# Patient Record
Sex: Male | Born: 1965 | Race: White | Hispanic: No | Marital: Married | Smoking: Never smoker
Health system: Southern US, Community
[De-identification: ages and names within clinical notes are randomized; demographics above are authoritative.]

## PROBLEM LIST (undated history)

## (undated) DIAGNOSIS — K219 Gastro-esophageal reflux disease without esophagitis: Secondary | ICD-10-CM

## (undated) DIAGNOSIS — K900 Celiac disease: Secondary | ICD-10-CM

---

## 2010-01-14 ENCOUNTER — Emergency Department (HOSPITAL_COMMUNITY): Admission: EM | Admit: 2010-01-14 | Discharge: 2010-01-14 | Payer: Self-pay | Admitting: Emergency Medicine

## 2013-07-11 ENCOUNTER — Emergency Department (HOSPITAL_COMMUNITY)
Admission: EM | Admit: 2013-07-11 | Discharge: 2013-07-11 | Disposition: A | Discharge status: Home / Self Care | Payer: Worker's Compensation | Attending: Emergency Medicine | Admitting: Emergency Medicine

## 2013-07-11 ENCOUNTER — Encounter (HOSPITAL_COMMUNITY): Payer: Self-pay | Admitting: Emergency Medicine

## 2013-07-11 ENCOUNTER — Emergency Department (HOSPITAL_COMMUNITY): Payer: Worker's Compensation

## 2013-07-11 DIAGNOSIS — S298XXA Other specified injuries of thorax, initial encounter: Secondary | ICD-10-CM | POA: Diagnosis present

## 2013-07-11 DIAGNOSIS — W01119A Fall on same level from slipping, tripping and stumbling with subsequent striking against unspecified sharp object, initial encounter: Secondary | ICD-10-CM | POA: Insufficient documentation

## 2013-07-11 DIAGNOSIS — Y9389 Activity, other specified: Secondary | ICD-10-CM | POA: Diagnosis not present

## 2013-07-11 DIAGNOSIS — Y9289 Other specified places as the place of occurrence of the external cause: Secondary | ICD-10-CM | POA: Insufficient documentation

## 2013-07-11 DIAGNOSIS — S20211A Contusion of right front wall of thorax, initial encounter: Secondary | ICD-10-CM

## 2013-07-11 DIAGNOSIS — Z880 Allergy status to penicillin: Secondary | ICD-10-CM | POA: Diagnosis not present

## 2013-07-11 DIAGNOSIS — S20219A Contusion of unspecified front wall of thorax, initial encounter: Secondary | ICD-10-CM | POA: Diagnosis not present

## 2013-07-11 DIAGNOSIS — Y990 Civilian activity done for income or pay: Secondary | ICD-10-CM | POA: Insufficient documentation

## 2013-07-11 DIAGNOSIS — W268XXA Contact with other sharp object(s), not elsewhere classified, initial encounter: Secondary | ICD-10-CM | POA: Insufficient documentation

## 2013-07-11 DIAGNOSIS — W010XXA Fall on same level from slipping, tripping and stumbling without subsequent striking against object, initial encounter: Secondary | ICD-10-CM | POA: Diagnosis not present

## 2013-07-11 MED ORDER — HYDROCODONE-ACETAMINOPHEN 5-325 MG PO TABS: 1.0000 | ORAL_TABLET | ORAL | Status: AC | PRN

## 2013-07-11 NOTE — Discharge Instructions (Signed)
Take the prescribed medication as directed. Recommend modifying activity to avoid excessive heavy lifting, pushing, pulling, etc which may further strain chest muscles. Return to the ED for new or worsening symptoms-- shortness of breath, change in pain, etc.

## 2013-07-11 NOTE — ED Provider Notes (Signed)
Medical screening examination/treatment/procedure(s) were performed by non-physician practitioner and as supervising physician I was immediately available for consultation/collaboration.   EKG Interpretation None       Leatrice Parilla, MD 07/11/13 2355 

## 2013-07-11 NOTE — ED Notes (Signed)
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 C/o R rib pain. Uncomfortable to breath deep or laugh

## 2013-07-11 NOTE — ED Provider Notes (Signed)
CSN: [id number]     Arrival date & time 07/11/13  1530 History  This chart was scribed for non-physician practitioner, Sharilyn SitesLisa Tyron Manetta , working with Doug SouSam Jacubowitz, MD, by Tana ConchStephen Methvin ED Scribe. This patient was seen in [facility] and the patient's care was started at 4:15 PM.      Chief Complaint  Patient presents with  . Fall  . Rib Pain       The history is provided by the patient. No language interpreter was used.   HPI Comments: Justin PintoMatthew Russell is a 48 y.o. male who presents to the Emergency Department complaining of rib pain that began when he fell getting off a forklift, he struck his right side on a sharp corner on the forklift's superstructure. He reports it being "uncomfortable" and painful when he twists. He also states that he has pain when he breaths in deeply and coughs. He denies SOB.  No prior rib injuries.  No intervention tried PTA.   History reviewed. No pertinent past medical history. History reviewed. No pertinent past surgical history. No family history on file. History  Substance Use Topics  . Smoking status: Never Smoker   . Smokeless tobacco: Not on file  . Alcohol Use: Yes     Comment: occasionally    Review of Systems  Musculoskeletal: Positive for myalgias (right ribs).  Psychiatric/Behavioral: Negative for confusion.  All other systems reviewed and are negative.     Allergies  Penicillins  Home Medications   Prior to Admission medications   Not on File   BP 147/97  Pulse 67  Temp(Src) 97.9 F (36.6 C) (Oral)  Resp 18  SpO2 98% Physical Exam  Nursing note and vitals reviewed. Constitutional: He is oriented to person, place, and time. He appears well-developed and well-nourished.  HENT:  Head: Normocephalic and atraumatic.  Mouth/Throat: Oropharynx is clear and moist.  Eyes: Conjunctivae and EOM are normal. Pupils are equal, round, and reactive to light.  Neck: Normal range of motion.  Cardiovascular: Normal rate, regular rhythm  and normal heart sounds.   Pulmonary/Chest: Effort normal and breath sounds normal. No respiratory distress. He has no wheezes. He exhibits tenderness and bony tenderness. He exhibits no laceration, no crepitus, no deformity and no swelling.    TTP of right medial anterior ribs; no bruising or gross deformity; no crepitus or flail segment; lungs CTAB  Musculoskeletal: Normal range of motion.  Neurological: He is alert and oriented to person, place, and time.  Skin: Skin is warm and dry.  Psychiatric: He has a normal mood and affect.    ED Course  Procedures (including critical care time)  DIAGNOSTIC STUDIES: Oxygen Saturation is 98% on RA, normal by my interpretation.    COORDINATION OF CARE:  4:18 PM-Discussed treatment plan which includes XRAY results, pain medication, and signs to look out for if his injury worsens with pt at bedside and pt agreed to plan.   Labs Review Labs Reviewed - No data to display  Imaging Review Dg Ribs Unilateral W/chest Right  07/11/2013   CLINICAL DATA:  Fall with right-sided pain.  EXAM: RIGHT RIBS AND CHEST - 3+ VIEW  COMPARISON:  03/01/2006.  FINDINGS: Lungs are clear. Cardiomediastinal silhouette is within normal. There is no evidence of right rib fracture. Remainder the exam is unchanged.  IMPRESSION: Negative.   Electronically Signed   By: Elberta Fortisaniel  Boyle M.D.   On: 07/11/2013 16:10     EKG Interpretation None      MDM  Final diagnoses:  Contusion of rib on right side   X-ray negative for acute rib fx or pneumothorax.  On exam, patient is in no respiratory distress, oxygen saturation normal on room air at 98%. He'll be discharged home with pain medication. He will followup for any new or worsening symptoms including shortness of breath or change in nature of his pain. Discussed plan with patient, he/she acknowledged understanding and agreed with plan of care.  Return precautions given for new or worsening symptoms.  I personally performed  the services described in this documentation, which was scribed in my presence. The recorded information has been reviewed and is accurate.  Garlon HatchetLisa M Digby Groeneveld, PA-C 07/11/13 [phone]

## 2013-07-11 NOTE — ED Notes (Signed)
Pt ambulatory to exam room with steady gait.  

## 2014-12-30 IMAGING — CR DG RIBS W/ CHEST 3+V*R*
4 series · 4 of 4 positions shown · non-contrast
Comparison: 03/01/2006.

CLINICAL DATA: Fall with right-sided pain.

EXAM:
RIGHT RIBS AND CHEST - 3+ VIEW

[w chest pa]
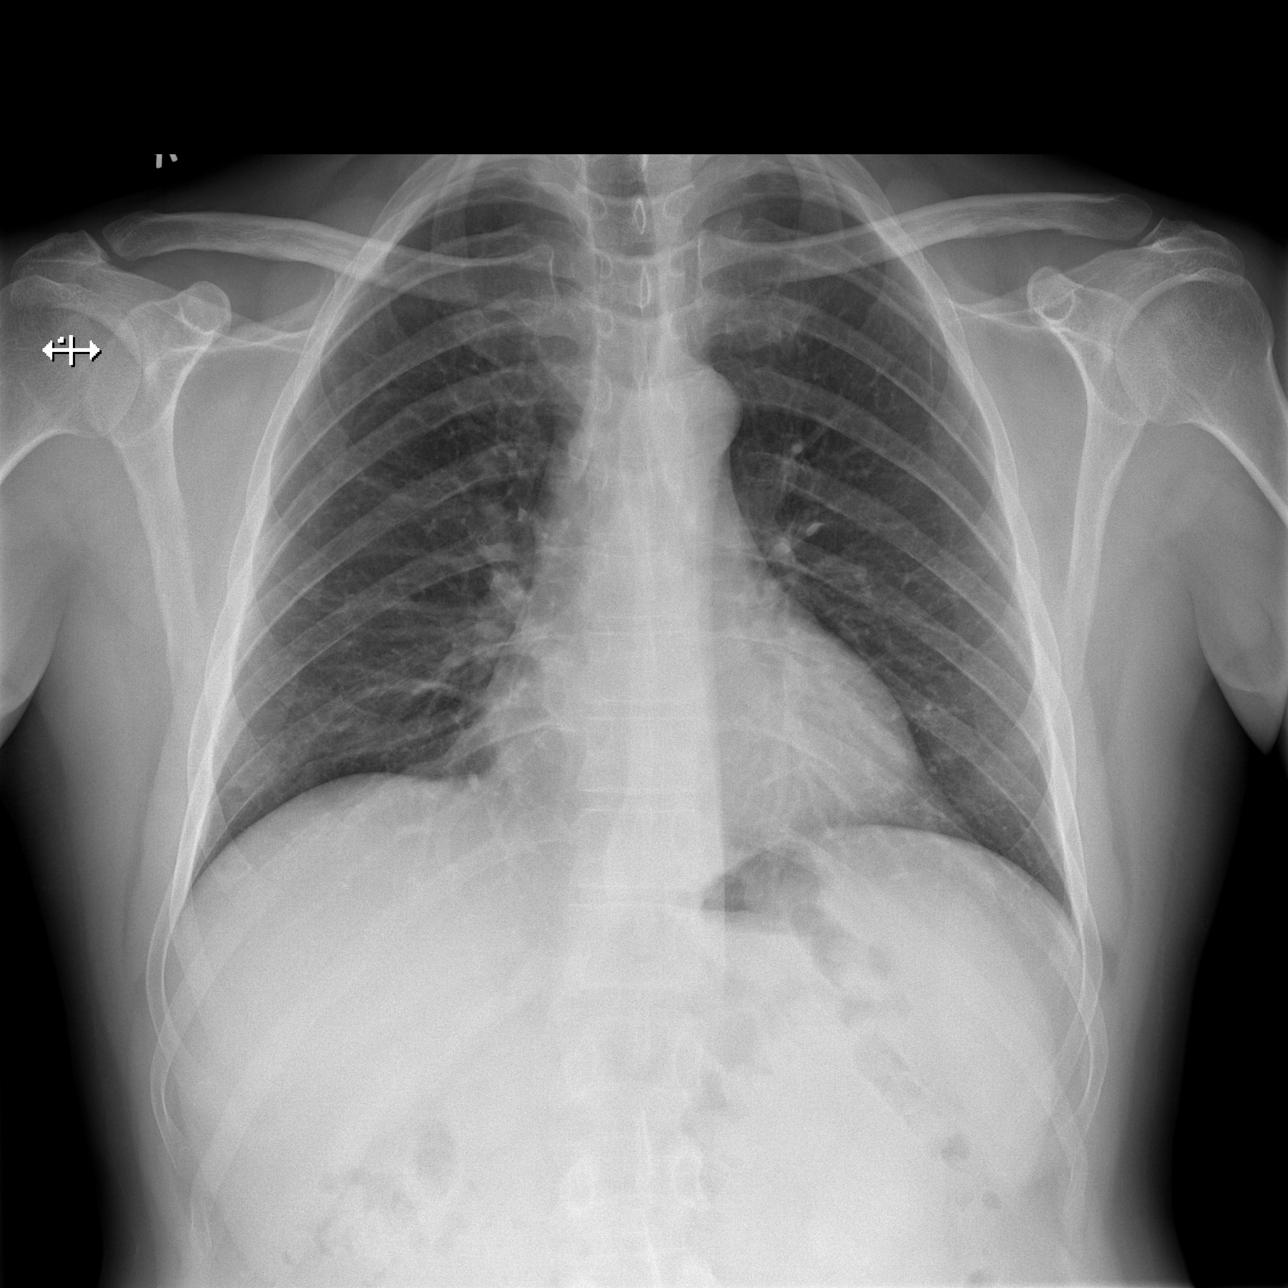

[w ribs ap upper right]
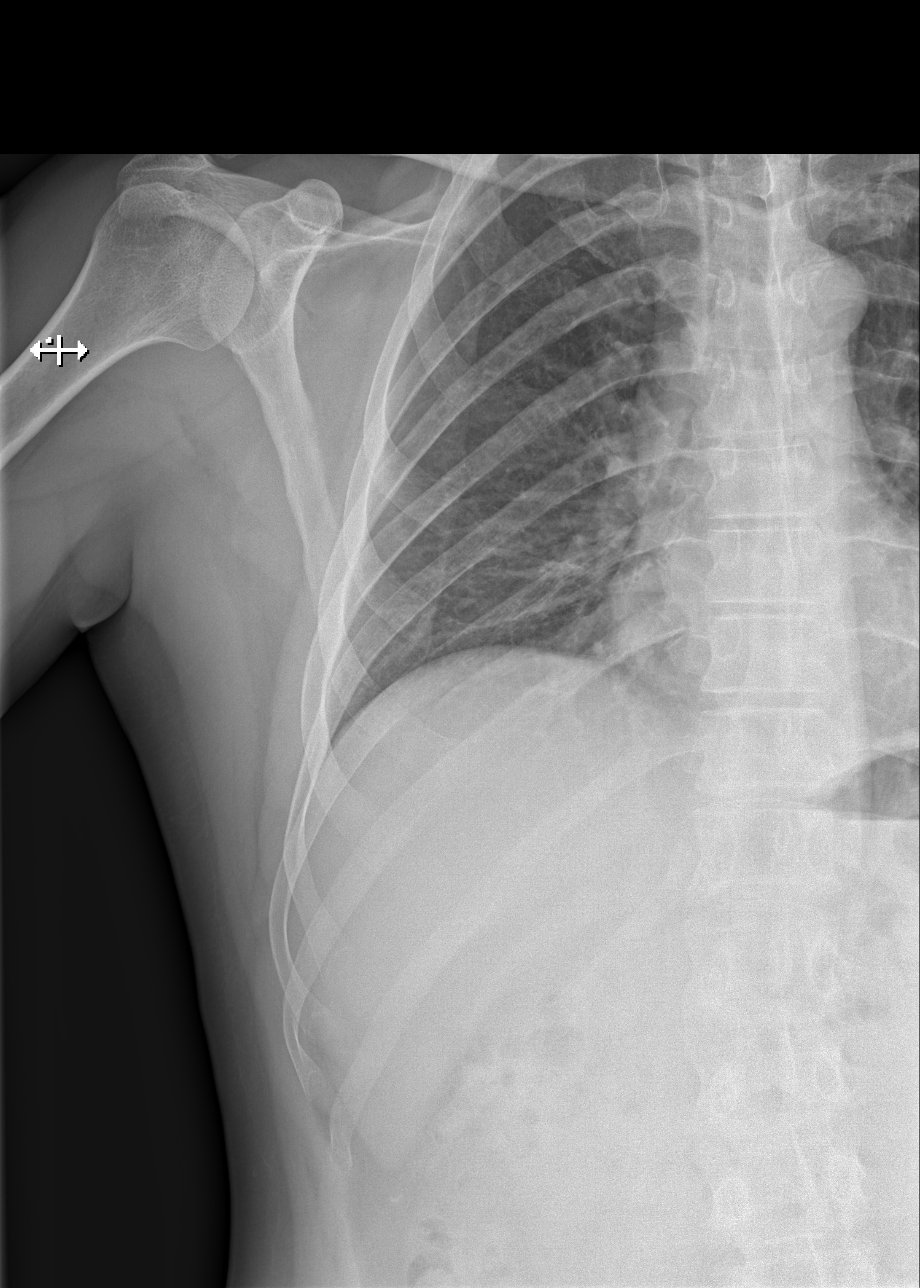

[w ribs ap lower right]
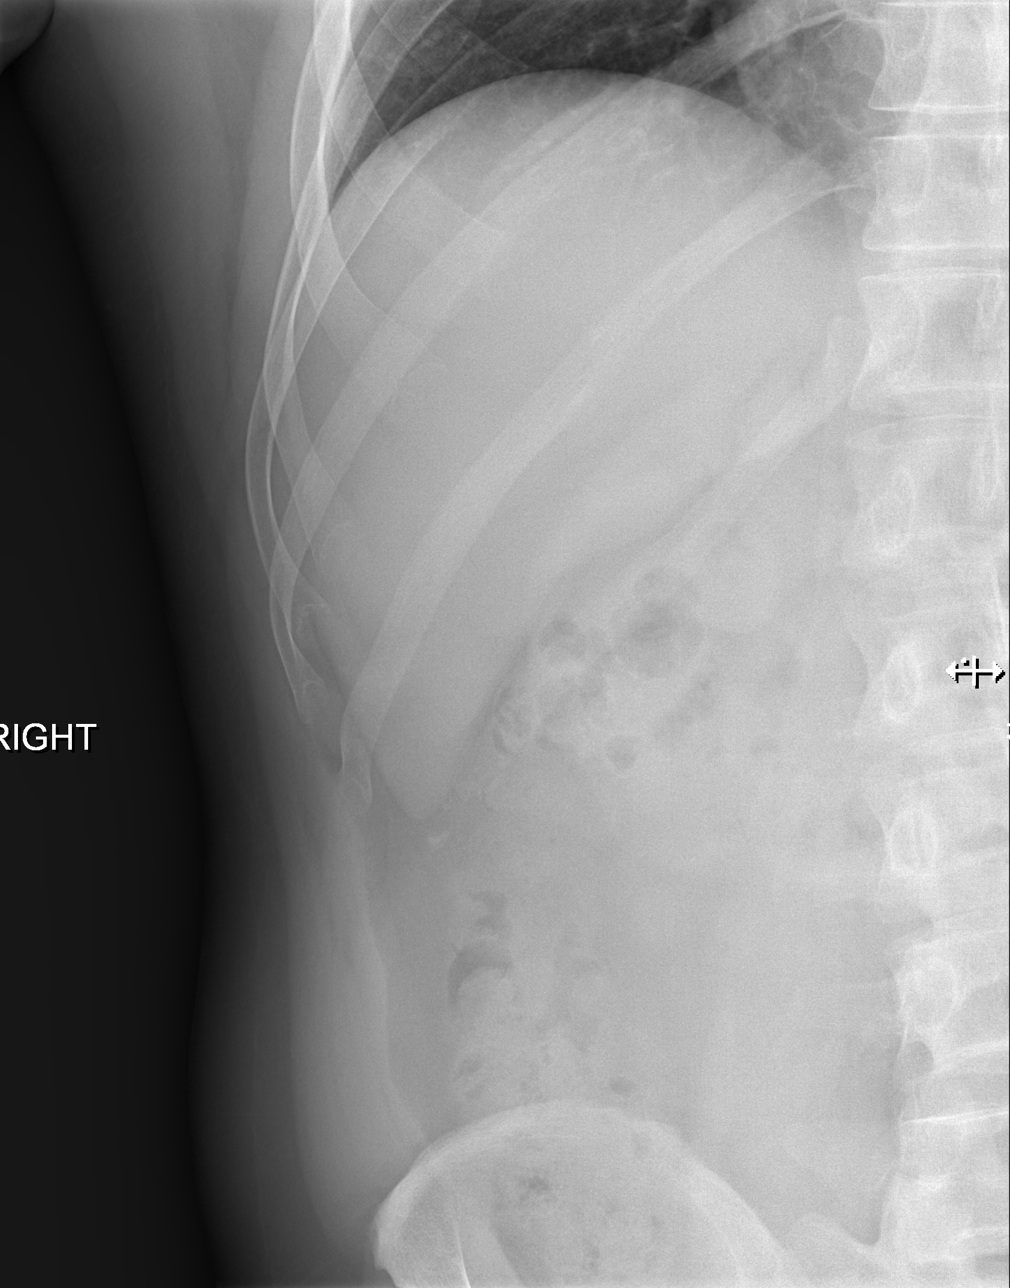

[w ribs obl right]
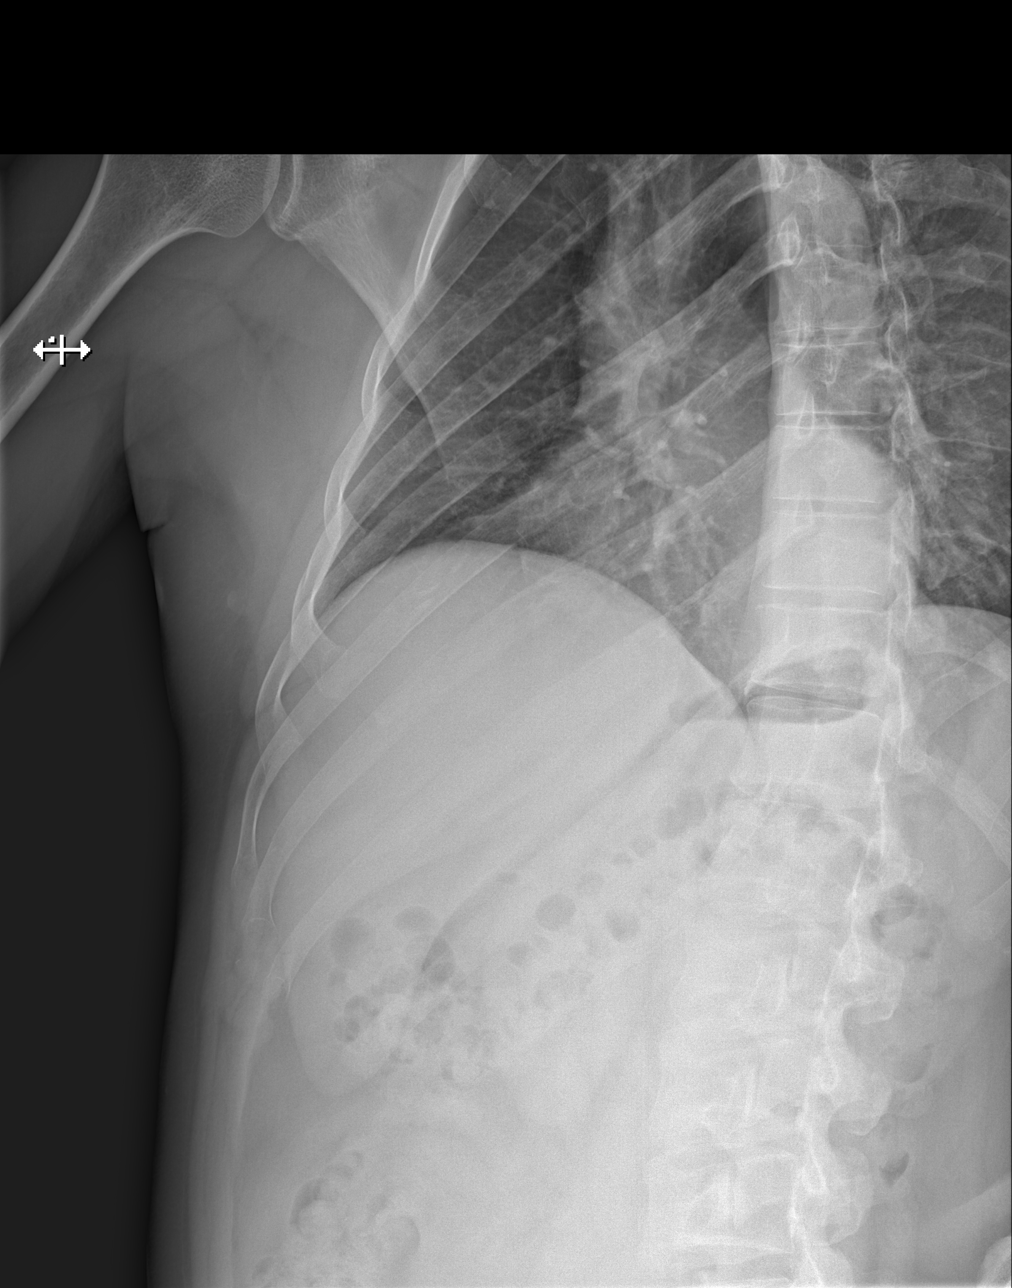

[4 of 4 positions shown; findings below may reference images not displayed]

FINDINGS: Lungs are clear. Cardiomediastinal silhouette is within normal.
There is no evidence of right rib fracture. Remainder the exam is
unchanged.
IMPRESSION: Negative.

## 2022-10-12 ENCOUNTER — Emergency Department (HOSPITAL_COMMUNITY): Payer: 59

## 2022-10-12 ENCOUNTER — Other Ambulatory Visit: Payer: Self-pay

## 2022-10-12 ENCOUNTER — Emergency Department (HOSPITAL_COMMUNITY)
Admission: EM | Admit: 2022-10-12 | Discharge: 2022-10-12 | Disposition: A | Discharge status: Home / Self Care | Payer: 59 | Attending: Emergency Medicine | Admitting: Emergency Medicine

## 2022-10-12 ENCOUNTER — Encounter (HOSPITAL_COMMUNITY): Payer: Self-pay | Admitting: Emergency Medicine

## 2022-10-12 DIAGNOSIS — R112 Nausea with vomiting, unspecified: Secondary | ICD-10-CM

## 2022-10-12 DIAGNOSIS — R001 Bradycardia, unspecified: Secondary | ICD-10-CM | POA: Insufficient documentation

## 2022-10-12 DIAGNOSIS — K219 Gastro-esophageal reflux disease without esophagitis: Secondary | ICD-10-CM | POA: Diagnosis not present

## 2022-10-12 DIAGNOSIS — D72829 Elevated white blood cell count, unspecified: Secondary | ICD-10-CM | POA: Insufficient documentation

## 2022-10-12 DIAGNOSIS — R059 Cough, unspecified: Secondary | ICD-10-CM | POA: Diagnosis present

## 2022-10-12 DIAGNOSIS — U071 COVID-19: Secondary | ICD-10-CM | POA: Insufficient documentation

## 2022-10-12 HISTORY — DX: Gastro-esophageal reflux disease without esophagitis: K21.9

## 2022-10-12 HISTORY — DX: Celiac disease: K90.0

## 2022-10-12 LAB — BASIC METABOLIC PANEL
Anion gap: 6 (ref 5–15)
BUN: 26 mg/dL — ABNORMAL HIGH (ref 6–20)
CO2: 25 mmol/L (ref 22–32)
Calcium: 9.4 mg/dL (ref 8.9–10.3)
Chloride: 108 mmol/L (ref 98–111)
Creatinine, Ser: 0.98 mg/dL (ref 0.61–1.24)
GFR, Estimated: >60 mL/min (ref >60)
Glucose, Bld: 105 mg/dL — ABNORMAL HIGH (ref 70–99)
Potassium: 4.2 mmol/L (ref 3.5–5.1)
Sodium: 139 mmol/L (ref 135–145)

## 2022-10-12 LAB — HEPATIC FUNCTION PANEL
ALT: 41 U/L (ref 0–44)
AST: 24 U/L (ref 15–41)
Albumin: 3.6 g/dL (ref 3.5–5.0)
Alkaline Phosphatase: 61 U/L (ref 38–126)
Bilirubin, Direct: <0.1 mg/dL (ref 0.0–0.2)
Total Bilirubin: 0.6 mg/dL (ref 0.3–1.2)
Total Protein: 6.8 g/dL (ref 6.5–8.1)

## 2022-10-12 LAB — CBC
HCT: 41.9 % (ref 39.0–52.0)
Hemoglobin: 14.7 g/dL (ref 13.0–17.0)
MCH: 33.6 pg (ref 26.0–34.0)
MCHC: 35.1 g/dL (ref 30.0–36.0)
MCV: 95.9 fL (ref 80.0–100.0)
Platelets: 253 10*3/uL (ref 150–400)
RBC: 4.37 MIL/uL (ref 4.22–5.81)
RDW: 11.6 % (ref 11.5–15.5)
WBC: 14.4 10*3/uL — ABNORMAL HIGH (ref 4.0–10.5)
nRBC: 0 % (ref 0.0–0.2)

## 2022-10-12 LAB — TROPONIN I (HIGH SENSITIVITY)
Troponin I (High Sensitivity): <2 ng/L (ref <18)
Troponin I (High Sensitivity): <2 ng/L (ref <18)

## 2022-10-12 LAB — LIPASE, BLOOD: Lipase: 28 U/L (ref 11–51)

## 2022-10-12 LAB — SARS CORONAVIRUS 2 BY RT PCR: SARS Coronavirus 2 by RT PCR: POSITIVE — AB

## 2022-10-12 MED ORDER — ONDANSETRON HCL 4 MG PO TABS: 4.0000 mg | ORAL_TABLET | Freq: Four times a day (QID) | ORAL | 0 refills | Status: AC

## 2022-10-12 MED ORDER — FAMOTIDINE IN NACL 20-0.9 MG/50ML-% IV SOLN
20.0000 mg | Freq: Once | INTRAVENOUS | Status: AC
  Administered 2022-10-12: 20 mg via INTRAVENOUS
  Filled 2022-10-12: qty 50

## 2022-10-12 NOTE — ED Provider Notes (Signed)
Received handoff from Evlyn Kanner PA. Pt had N/Vx1 day. COVID Positive 2 weeks ago. No abd pain. Waiting on LFTs, and PO trial. DC w/zofran ODT.   Physical Exam  BP 110/80 (BP Location: Left Arm)   Pulse (!) 56   Temp 98 F (36.7 C) (Oral)   Resp 16   SpO2 98%   Physical Exam Vitals and nursing note reviewed.  Constitutional:      General: He is not in acute distress.    Appearance: He is well-developed.  HENT:     Head: Normocephalic and atraumatic.  Eyes:     Conjunctiva/sclera: Conjunctivae normal.  Cardiovascular:     Rate and Rhythm: Normal rate and regular rhythm.     Heart sounds: No murmur heard. Pulmonary:     Effort: Pulmonary effort is normal. No respiratory distress.     Breath sounds: Normal breath sounds.  Abdominal:     Palpations: Abdomen is soft.     Tenderness: There is no abdominal tenderness.  Musculoskeletal:        General: No swelling.     Cervical back: Neck supple.  Skin:    General: Skin is warm and dry.     Capillary Refill: Capillary refill takes less than 2 seconds.  Neurological:     Mental Status: He is alert.  Psychiatric:        Mood and Affect: Mood normal.     Procedures  Procedures  ED Course / MDM    Medical Decision Making Amount and/or Complexity of Data Reviewed Labs: ordered. Radiology: ordered.  Risk Prescription drug management.   Normal LFTs, no elevation of lipase or troponin.  Mild leukocytosis, but patient overall appearing, and did have episodes of nausea vomiting, so much possibly reactive secondary to the vomitus.  Patient able to tolerate p.o. intake.  Discharged with Zofran, likely related to acid reflux versus a viral infection.  We discussed the importance of follow-up with PCP, and return precautions.  He did have COVID 2 weeks ago, and is still testing positive, we discussed return precautions and he was discharged home.      Pete Pelt, [place] 10/12/22 1823    Loetta Rough, MD 10/12/22  2025

## 2022-10-12 NOTE — ED Provider Notes (Signed)
EMERGENCY DEPARTMENT AT [facility] Provider Note   CSN: [id number] Arrival date & time: 10/12/22  1225     History  Chief Complaint  Patient presents with   Cough   Abdominal Pain   Chest Pain    Justin Russell is a 57 y.o. male history of celiac disease, GERD presented after having nausea and vomiting that began last night after dinner.  Patient states that he had a piece meal with sausages previously but notes that he began having a productive cough afterwards.  Patient states that he also had a few episodes of emesis that was nonbloody resulting in a burning chest pain that is since gotten better.  Patient denies a chest pain rating to his back, left arm left neck.  Home Medications Prior to Admission medications   Medication Sig Start Date End Date Taking? Authorizing Provider  amLODipine (NORVASC) 10 MG tablet Take 10 mg by mouth daily.   Yes [provider]  cetirizine (ZYRTEC) 10 MG tablet Take 10 mg by mouth daily.   Yes [provider]  Multiple Vitamin (THERA) TABS Take 1 tablet by mouth daily.   Yes [provider]  Omega-3 Fatty Acids (FISH OIL) 1000 MG CAPS Take 1,000 mg by mouth daily.   Yes [provider]  ondansetron (ZOFRAN) 4 MG tablet Take 1 tablet (4 mg total) by mouth every 6 (six) hours. 10/12/22  Yes Justin Corrigan, PA-C  pantoprazole (PROTONIX) 40 MG tablet Take 40 mg by mouth 2 (two) times daily.   Yes [provider]  propranolol ER (INDERAL LA) 60 MG 24 hr capsule Take 1 tablet by mouth daily. 09/24/22  Yes [provider]  SODIUM FLUORIDE 5000 SENSITIVE 1.1-5 % GEL Take 1 Application by mouth daily. 10/10/22  Yes [provider]  HYDROcodone-acetaminophen (NORCO/VICODIN) 5-325 MG per tablet Take 1 tablet by mouth every 4 (four) hours as needed. Patient not taking: Reported on 10/12/2022 07/11/13   Justin Hatchet, PA-C  omeprazole (PRILOSEC) 20 MG capsule Take 20 mg by mouth  daily as needed (for acid reflux). Patient not taking: Reported on 10/12/2022    [provider]      Allergies    Penicillins    Review of Systems   Review of Systems  Respiratory:  Positive for cough.   Cardiovascular:  Positive for chest pain.  Gastrointestinal:  Positive for abdominal pain.    Physical Exam Updated Vital Signs BP 110/80 (BP Location: Left Arm)   Pulse (!) 56   Temp 98 F (36.7 C) (Oral)   Resp 16   SpO2 98%  Physical Exam Vitals reviewed.  Constitutional:      General: He is not in acute distress. HENT:     Head: Normocephalic and atraumatic.  Eyes:     Extraocular Movements: Extraocular movements intact.     Conjunctiva/sclera: Conjunctivae normal.     Pupils: Pupils are equal, round, and reactive to light.  Cardiovascular:     Rate and Rhythm: Regular rhythm. Bradycardia present.     Pulses: Normal pulses.     Heart sounds: Normal heart sounds.     Comments: 2+ bilateral radial/dorsalis pedis pulses with bradycardic rate Pulmonary:     Effort: Pulmonary effort is normal. No respiratory distress.     Breath sounds: Normal breath sounds.  Abdominal:     Palpations: Abdomen is soft.     Tenderness: There is no abdominal tenderness. There is no guarding or rebound.  Musculoskeletal:        General: Normal range of motion.     Cervical back: Normal range of motion and neck supple.     Right lower leg: No edema.     Left lower leg: No edema.     Comments: 5 out of 5 bilateral grip/leg extension strength  Skin:    General: Skin is warm and dry.     Capillary Refill: Capillary refill takes less than 2 seconds.  Neurological:     General: No focal deficit present.     Mental Status: He is alert and oriented to person, place, and time.     Comments: Sensation intact in all 4 limbs  Psychiatric:        Mood and Affect: Mood normal.     ED Results / Procedures / Treatments   Labs (all labs ordered are listed, but only abnormal results  are displayed) Labs Reviewed  SARS CORONAVIRUS 2 BY RT PCR - Abnormal; Notable for the following components:      Result Value   SARS Coronavirus 2 by RT PCR POSITIVE (*)    All other components within normal limits  BASIC METABOLIC PANEL - Abnormal; Notable for the following components:   Glucose, Bld 105 (*)    BUN 26 (*)    All other components within normal limits  CBC - Abnormal; Notable for the following components:   WBC 14.4 (*)    All other components within normal limits  HEPATIC FUNCTION PANEL  LIPASE, BLOOD  TROPONIN I (HIGH SENSITIVITY)  TROPONIN I (HIGH SENSITIVITY)    EKG None  Radiology DG Chest 2 View  Result Date: 10/12/2022 CLINICAL DATA:  Chest pain.  Abdominal pain.  Vomiting.  Cough. EXAM: CHEST - 2 VIEW COMPARISON:  Chest and right rib radiographs 07/11/2013 FINDINGS: Cardiac silhouette and mediastinal contours are within normal limits. There is a band of new horizontal linear density within the left mid lung that appears to be within the anterior left upper lobe but may be partially within the superior segment of the left lower lobe. The right lung is clear. No pleural effusion or pneumothorax. Minimal multilevel degenerative disc changes of the thoracic spine. IMPRESSION: New horizontal linear density within the left mid lung may represent atelectasis or pneumonia. Recommend follow-up chest radiographs in 6-8 weeks to ensure resolution. Electronically Signed   By: Justin Russell M.D.   On: 10/12/2022 14:43    Procedures Procedures    Medications Ordered in ED Medications  famotidine (PEPCID) IVPB 20 mg premix (has no administration in time range)    ED Course/ Medical Decision Making/ A&P                                 Medical Decision Making Amount and/or Complexity of Data Reviewed Labs: ordered. Radiology: ordered.   [name] 57 y.o. presented today for N/V, cough. Working DDx that I considered at this time includes, but not limited to,  covid, anemia, sepsis, electrolyte abnormalities, PNA, ACS, GERD, esophageal rupture, PE, dehydration.  R/o DDx: anemia, sepsis, electrolyte abnormalities, PNA, ACS, esophageal rupture, PE, dehydration: These are considered less likely due to history of present illness and physical exam findings  Review of prior external notes: 06/12/2022 office visit  Unique Tests and My Interpretation:  COVID: Positive BMP: Unremarkable CBC on leukocytosis 14.4 Chest x-ray: Your horizontal opacity in left midlung representative of pneumonia or atelectasis, recommend  repeat in 6 to 8 weeks Troponin: Less than 2 Hepatic function panel: Pending Lipase: Pending  Discussion with Independent Historian:  Wife  Discussion of Management of Tests: None  Risk: Medium: prescription drug management  Risk Stratification Score: None  Plan: On exam patient was in no acute distress stable vitals.  Patient physical exam was largely unremarkable.  Patient was endorsing nausea and vomiting with cough suspicious of viral etiology and so COVID was added to the labs from triage.  The rest of patient's labs are triage reassuring but patient did have a leukocytosis of 14.4.  Patient did test positive for COVID which would explain the leukocytosis.  Currently waiting on hepatic function panel and lipase this patient was endorsing nausea vomiting although he states he does have history of acid reflux and this feels like an exacerbation.  Will give patient Pepcid for his emesis.  Patient not currently nauseous or requiring Zofran.  Pending labs patient may be discharged with primary care follow-up.  Patient is currently outside the window for Paxlovid as he tested positive for COVID 2 weeks ago.  Suspect pneumonia found on chest x-ray is related to patient's COVID and he does not need antibiotics at this time.  Patient was given return precautions. Patient stable for discharge at this time.  Patient verbalized understanding of  plan.         Final Clinical Impression(s) / ED Diagnoses Final diagnoses:  COVID  Gastroesophageal reflux disease without esophagitis    Rx / DC Orders ED Discharge Orders          Ordered    ondansetron (ZOFRAN) 4 MG tablet  Every 6 hours        10/12/22 1530              Justin Corrigan, PA-C 10/12/22 1531    Derwood Kaplan, MD 10/13/22 [phone]

## 2022-10-12 NOTE — ED Triage Notes (Signed)
About 0100 today, the patient reports vomiting, He thought he aspirated and noticed a cough since then. His suffers from GERD and believes he ate something that brought this on.  He report hoarseness up until 1 hour ago, chest and abdominal pain.

## 2022-10-12 NOTE — Discharge Instructions (Addendum)
Please follow-up with your primary care doctor, take the nausea medicine as needed for your nausea.  Return to the ER if you feel like you are becoming very short of breath, confused, or have chest pain.

## 2023-12-07 ENCOUNTER — Other Ambulatory Visit: Payer: Self-pay

## 2023-12-07 ENCOUNTER — Emergency Department (HOSPITAL_BASED_OUTPATIENT_CLINIC_OR_DEPARTMENT_OTHER)
Admission: EM | Admit: 2023-12-07 | Discharge: 2023-12-07 | Disposition: A | Discharge status: Home / Self Care | Attending: Emergency Medicine | Admitting: Emergency Medicine

## 2023-12-07 ENCOUNTER — Encounter (HOSPITAL_BASED_OUTPATIENT_CLINIC_OR_DEPARTMENT_OTHER): Payer: Self-pay | Admitting: Emergency Medicine

## 2023-12-07 DIAGNOSIS — S8991XA Unspecified injury of right lower leg, initial encounter: Secondary | ICD-10-CM | POA: Diagnosis present

## 2023-12-07 DIAGNOSIS — W298XXA Contact with other powered powered hand tools and household machinery, initial encounter: Secondary | ICD-10-CM | POA: Insufficient documentation

## 2023-12-07 DIAGNOSIS — Z23 Encounter for immunization: Secondary | ICD-10-CM | POA: Insufficient documentation

## 2023-12-07 DIAGNOSIS — S81811A Laceration without foreign body, right lower leg, initial encounter: Secondary | ICD-10-CM | POA: Diagnosis not present

## 2023-12-07 MED ORDER — LIDOCAINE-EPINEPHRINE (PF) 2 %-1:200000 IJ SOLN
10.0000 mL | Freq: Once | INTRAMUSCULAR | Status: AC
  Administered 2023-12-07: 10 mL
  Filled 2023-12-07: qty 20

## 2023-12-07 MED ORDER — TETANUS-DIPHTH-ACELL PERTUSSIS 5-2-15.5 LF-MCG/0.5 IM SUSP
0.5000 mL | Freq: Once | INTRAMUSCULAR | Status: AC
  Administered 2023-12-07: 0.5 mL via INTRAMUSCULAR

## 2023-12-07 MED ORDER — TETANUS-DIPHTH-ACELL PERTUSSIS 5-2.5-18.5 LF-MCG/0.5 IM SUSY
PREFILLED_SYRINGE | INTRAMUSCULAR | Status: AC
  Filled 2023-12-07: qty 0.5

## 2023-12-07 NOTE — ED Provider Notes (Signed)
 [place] EMERGENCY DEPARTMENT AT [facility] Provider Note   CSN: [id number] Arrival date & time: 12/07/23  1726     Patient presents with: Extremity Laceration   Justin Russell is a 58 y.o. male.   58 year old male presenting with right lower extremity laceration.  Patient was using power tools in an attempt to uproot a stump, the power tool lurched back and made contact with his anterior lower leg.  He is not on blood thinners, he is not sure if his tetanus is up-to-date.  Bleeding is controlled.  No other injuries to report.        Prior to Admission medications   Medication Sig Start Date End Date Taking? Authorizing Provider  amLODipine (NORVASC) 10 MG tablet Take 10 mg by mouth daily.    [provider]  cetirizine (ZYRTEC) 10 MG tablet Take 10 mg by mouth daily.    [provider]  HYDROcodone -acetaminophen  (NORCO/VICODIN) 5-325 MG per tablet Take 1 tablet by mouth every 4 (four) hours as needed. Patient not taking: Reported on 10/12/2022 07/11/13   Jarold Olam HERO, PA-C  Multiple Vitamin (THERA) TABS Take 1 tablet by mouth daily.    [provider]  Omega-3 Fatty Acids (FISH OIL) 1000 MG CAPS Take 1,000 mg by mouth daily.    [provider]  omeprazole (PRILOSEC) 20 MG capsule Take 20 mg by mouth daily as needed (for acid reflux). Patient not taking: Reported on 10/12/2022    [provider]  ondansetron  (ZOFRAN ) 4 MG tablet Take 1 tablet (4 mg total) by mouth every 6 (six) hours. 10/12/22   Victor Lynwood DASEN, PA-C  pantoprazole (PROTONIX) 40 MG tablet Take 40 mg by mouth 2 (two) times daily.    [provider]  propranolol ER (INDERAL LA) 60 MG 24 hr capsule Take 1 tablet by mouth daily. 09/24/22   [provider]  SODIUM FLUORIDE 5000 SENSITIVE 1.1-5 % GEL Take 1 Application by mouth daily. 10/10/22   [provider]    Allergies: Penicillins    Review of Systems  Updated Vital Signs  Vitals:    12/07/23 1743  BP: 121/84  Pulse: 62  Resp: 18  Temp: 98.2 F (36.8 C)  TempSrc: Oral  SpO2: 96%     Physical Exam Vitals and nursing note reviewed.  HENT:     Head: Normocephalic.  Eyes:     Extraocular Movements: Extraocular movements intact.  Cardiovascular:     Rate and Rhythm: Normal rate.  Pulmonary:     Effort: Pulmonary effort is normal.  Musculoskeletal:     Cervical back: Normal range of motion.     Comments: Moves all extremities spontaneously without difficulty  Skin:    General: Skin is warm and dry.     Comments: ~4cm laceration to R shin, not actively bleeding, no visualized foreign body, see photo  Neurological:     Mental Status: He is alert and oriented to person, place, and time.     (all labs ordered are listed, but only abnormal results are displayed) Labs Reviewed - No data to display  EKG: None  Radiology: No results found.   .Laceration Repair  Date/Time: 12/07/2023 6:53 PM  Performed by: Glendia Rocky SAILOR, PA-C Authorized by: Glendia Rocky SAILOR, PA-C   Consent:    Consent obtained:  Verbal   Consent given by:  Patient   Risks, benefits, and alternatives were discussed: yes     Risks discussed:  Infection, pain, retained foreign body,  tendon damage, poor cosmetic result, poor wound healing, vascular damage, nerve damage and need for additional repair   Alternatives discussed:  No treatment Universal protocol:    Procedure explained and questions answered to patient or proxy's satisfaction: no     Patient identity confirmed:  Verbally with patient Anesthesia:    Anesthesia method:  Local infiltration   Local anesthetic:  Lidocaine 2% WITH epi Laceration details:    Location:  Leg   Leg location:  R lower leg   Length (cm):  4 Pre-procedure details:    Preparation:  Patient was prepped and draped in usual sterile fashion Exploration:    Hemostasis achieved with:  Epinephrine and direct pressure   Wound exploration: wound explored  through full range of motion and entire depth of wound visualized     Contaminated: no   Treatment:    Area cleansed with:  Chlorhexidine and saline   Amount of cleaning:  Standard   Irrigation solution:  Sterile saline   Irrigation method:  Syringe Skin repair:    Repair method:  Sutures   Suture size:  4-0   Suture material:  Prolene   Suture technique:  Simple interrupted   Number of sutures:  5 Approximation:    Approximation:  Close Repair type:    Repair type:  Simple Post-procedure details:    Dressing:  Antibiotic ointment and non-adherent dressing   Procedure completion:  Tolerated well, no immediate complications    Medications Ordered in the ED  lidocaine-EPINEPHrine (XYLOCAINE W/EPI) 2 %-1:200000 (PF) injection 10 mL (has no administration in time range)  Tdap (ADACEL) injection 0.5 mL (has no administration in time range)                                    Medical Decision Making This patient presents to the ED for concern of laceration, this involves an extensive number of treatment options, and is a complaint that carries with it a high risk of complications and morbidity.  The differential diagnosis includes laceration, laceration with retained foreign body, laceration with muscular involvement, laceration with fracture   Additional history obtained:  Additional history obtained from record review External records from outside source obtained and reviewed including prior ED note   Cardiac Monitoring: / EKG:  The patient was maintained on a cardiac monitor.  I personally viewed and interpreted the cardiac monitored which showed an underlying rhythm of: NSR   Problem List / ED Course / Critical interventions / Medication management  I ordered medication including lidocaine for laceration repair, Tdap to update tetanus immune status I have reviewed the patients home medicines and have made adjustments as needed   Test / Admission -  Considered:  Physical exam notable as above.  Patient's laceration was thoroughly cleansed using normal saline, entire depth of wound visualized without visualization of retained foreign body, laceration repaired as above.  Discussed signs/symptoms of wound infection in depth with the patient, he voiced understanding.  Return precautions discussed, I recommend that he follow-up with his PCP in 7 to 10 days for removal of his sutures.  He is appropriate for discharge at this time.    Risk Prescription drug management.       Final diagnoses:  Laceration of right lower leg, initial encounter    ED Discharge Orders     None          Hayes Czaja, Rocky SAILOR, PA-C  12/07/23 2325    Patsey Lot, MD 12/08/23 [phone]

## 2023-12-07 NOTE — ED Triage Notes (Signed)
 Lac to right lower leg with power tool Happened around 5pm Unknown last tetanus Ambulatory to triage

## 2023-12-07 NOTE — Discharge Instructions (Signed)
 Keep your laceration clean and dry for the first 24 hours.  Follow-up with your PCP for removal of your sutures in 7 to 10 days.  Monitor for signs of infection, including worsening redness/swelling/warmth/drainage from the site of your wound, return to the emergency department if these symptoms occur.

## 2024-02-06 ENCOUNTER — Emergency Department (HOSPITAL_BASED_OUTPATIENT_CLINIC_OR_DEPARTMENT_OTHER)
Admission: EM | Admit: 2024-02-06 | Discharge: 2024-02-06 | Disposition: A | Discharge status: Home / Self Care | Attending: Emergency Medicine | Admitting: Emergency Medicine

## 2024-02-06 ENCOUNTER — Emergency Department (HOSPITAL_BASED_OUTPATIENT_CLINIC_OR_DEPARTMENT_OTHER)

## 2024-02-06 ENCOUNTER — Other Ambulatory Visit: Payer: Self-pay

## 2024-02-06 DIAGNOSIS — R7401 Elevation of levels of liver transaminase levels: Secondary | ICD-10-CM | POA: Insufficient documentation

## 2024-02-06 DIAGNOSIS — R748 Abnormal levels of other serum enzymes: Secondary | ICD-10-CM | POA: Insufficient documentation

## 2024-02-06 DIAGNOSIS — K8020 Calculus of gallbladder without cholecystitis without obstruction: Secondary | ICD-10-CM

## 2024-02-06 DIAGNOSIS — D72829 Elevated white blood cell count, unspecified: Secondary | ICD-10-CM | POA: Insufficient documentation

## 2024-02-06 DIAGNOSIS — K8070 Calculus of gallbladder and bile duct without cholecystitis without obstruction: Secondary | ICD-10-CM | POA: Insufficient documentation

## 2024-02-06 DIAGNOSIS — R1011 Right upper quadrant pain: Secondary | ICD-10-CM | POA: Diagnosis present

## 2024-02-06 DIAGNOSIS — K8050 Calculus of bile duct without cholangitis or cholecystitis without obstruction: Secondary | ICD-10-CM

## 2024-02-06 LAB — COMPREHENSIVE METABOLIC PANEL WITH GFR
ALT: 276 U/L — ABNORMAL HIGH (ref 0–44)
AST: 278 U/L — ABNORMAL HIGH (ref 15–41)
Albumin: 4.6 g/dL (ref 3.5–5.0)
Alkaline Phosphatase: 128 U/L — ABNORMAL HIGH (ref 38–126)
Anion gap: 9 (ref 5–15)
BUN: 18 mg/dL (ref 6–20)
CO2: 28 mmol/L (ref 22–32)
Calcium: 10.3 mg/dL (ref 8.9–10.3)
Chloride: 103 mmol/L (ref 98–111)
Creatinine, Ser: 0.94 mg/dL (ref 0.61–1.24)
GFR, Estimated: >60 mL/min (ref >60)
Glucose, Bld: 154 mg/dL — ABNORMAL HIGH (ref 70–99)
Potassium: 4.4 mmol/L (ref 3.5–5.1)
Sodium: 139 mmol/L (ref 135–145)
Total Bilirubin: 1.6 mg/dL — ABNORMAL HIGH (ref 0.0–1.2)
Total Protein: 8.1 g/dL (ref 6.5–8.1)

## 2024-02-06 LAB — URINALYSIS, ROUTINE W REFLEX MICROSCOPIC
Bilirubin Urine: NEGATIVE
Glucose, UA: NEGATIVE mg/dL
Hgb urine dipstick: NEGATIVE
Ketones, ur: NEGATIVE mg/dL
Leukocytes,Ua: NEGATIVE
Nitrite: NEGATIVE
Specific Gravity, Urine: 1.016 (ref 1.005–1.030)
pH: 7.5 (ref 5.0–8.0)

## 2024-02-06 LAB — CBC
HCT: 42.3 % (ref 39.0–52.0)
Hemoglobin: 15.3 g/dL (ref 13.0–17.0)
MCH: 34.2 pg — ABNORMAL HIGH (ref 26.0–34.0)
MCHC: 36.2 g/dL — ABNORMAL HIGH (ref 30.0–36.0)
MCV: 94.4 fL (ref 80.0–100.0)
Platelets: 265 K/uL (ref 150–400)
RBC: 4.48 MIL/uL (ref 4.22–5.81)
RDW: 11.7 % (ref 11.5–15.5)
WBC: 11.1 K/uL — ABNORMAL HIGH (ref 4.0–10.5)
nRBC: 0 % (ref 0.0–0.2)

## 2024-02-06 LAB — LIPASE, BLOOD: Lipase: 26 U/L (ref 11–51)

## 2024-02-06 NOTE — ED Triage Notes (Signed)
 C/o RLQ pain starting today. -n/v/d.

## 2024-02-06 NOTE — Discharge Instructions (Addendum)
 You have gallstones noted on your ultrasound today.  This would explain the elevated liver and gallbladder labs that we see today. Your abdominal pain from today was likely due to the stones moving around and getting lodged in the tube that drains the gallbladder.  Thankfully, we do not see any stones that are currently stuck in the tube that drains gallbladder.  We do not see any signs of infection on your ultrasound.    Please schedule follow-up appointment with the general surgery team listed below.  They will be able to discuss removing your gallbladder to prevent further attacks.   Please avoid any fatty or fried foods as this can worsen gallbladder pain.  Please return immediately to the emergency room if you develop severe return of abdominal pain, fever, persistent vomiting, any other new or concerning symptoms

## 2024-02-06 NOTE — ED Provider Notes (Signed)
 [place] EMERGENCY DEPARTMENT AT [facility] Provider Note   CSN: [id number] Arrival date & time: 02/06/24  1644     Patient presents with: Abdominal Pain   Justin Russell is a 58 y.o. male with history of celiac disease, GERD, presents with concern for right upper quadrant pain that started at about 10 AM today.  He reports the pain came on fairly suddenly and lasted about 8 hours.  He denies any pain in his chest or any associated shortness of breath.  No fever or chills.  No nausea, vomiting, or diarrhea.  He reports his last bowel movement was this morning and was normal.  He reports that he has had similar intermittent right upper quadrant pain in the past, mostly this seems to awaken him from his sleep.  However, he has not had pain this persistent previously.  Pain seems to have gotten better on its own upon arrival to the emergency room.    Abdominal Pain      Prior to Admission medications   Medication Sig Start Date End Date Taking? Authorizing Provider  amLODipine (NORVASC) 10 MG tablet Take 10 mg by mouth daily.    [provider]  cetirizine (ZYRTEC) 10 MG tablet Take 10 mg by mouth daily.    [provider]  HYDROcodone -acetaminophen  (NORCO/VICODIN) 5-325 MG per tablet Take 1 tablet by mouth every 4 (four) hours as needed. Patient not taking: Reported on 10/12/2022 07/11/13   Jarold Olam HERO, PA-C  Multiple Vitamin (THERA) TABS Take 1 tablet by mouth daily.    [provider]  Omega-3 Fatty Acids (FISH OIL) 1000 MG CAPS Take 1,000 mg by mouth daily.    [provider]  omeprazole (PRILOSEC) 20 MG capsule Take 20 mg by mouth daily as needed (for acid reflux). Patient not taking: Reported on 10/12/2022    [provider]  ondansetron  (ZOFRAN ) 4 MG tablet Take 1 tablet (4 mg total) by mouth every 6 (six) hours. 10/12/22   Victor Lynwood DASEN, PA-C  pantoprazole (PROTONIX) 40 MG tablet Take 40 mg by mouth 2 (two) times daily.     [provider]  propranolol ER (INDERAL LA) 60 MG 24 hr capsule Take 1 tablet by mouth daily. 09/24/22   [provider]  SODIUM FLUORIDE 5000 SENSITIVE 1.1-5 % GEL Take 1 Application by mouth daily. 10/10/22   [provider]    Allergies: Penicillins    Review of Systems  Gastrointestinal:  Positive for abdominal pain.    Updated Vital Signs BP 125/88 (BP Location: Right Arm)   Pulse 67   Temp 98.2 F (36.8 C) (Oral)   Resp 18   SpO2 97%   Physical Exam Vitals and nursing note reviewed.  Constitutional:      General: He is not in acute distress.    Appearance: He is well-developed.  HENT:     Head: Normocephalic and atraumatic.  Eyes:     Conjunctiva/sclera: Conjunctivae normal.  Cardiovascular:     Rate and Rhythm: Normal rate and regular rhythm.     Heart sounds: No murmur heard. Pulmonary:     Effort: Pulmonary effort is normal. No respiratory distress.     Breath sounds: Normal breath sounds.  Abdominal:     Palpations: Abdomen is soft.     Tenderness: There is no abdominal tenderness.     Comments: Negative Murphy sign  Musculoskeletal:        General: No swelling.     Cervical back:  Neck supple.  Skin:    General: Skin is warm and dry.     Capillary Refill: Capillary refill takes less than 2 seconds.  Neurological:     Mental Status: He is alert.  Psychiatric:        Mood and Affect: Mood normal.     (all labs ordered are listed, but only abnormal results are displayed) Labs Reviewed  COMPREHENSIVE METABOLIC PANEL WITH GFR - Abnormal; Notable for the following components:      Result Value   Glucose, Bld 154 (*)    AST 278 (*)    ALT 276 (*)    Alkaline Phosphatase 128 (*)    Total Bilirubin 1.6 (*)    All other components within normal limits  CBC - Abnormal; Notable for the following components:   WBC 11.1 (*)    MCH 34.2 (*)    MCHC 36.2 (*)    All other components within normal limits  LIPASE, BLOOD  URINALYSIS,  ROUTINE W REFLEX MICROSCOPIC    EKG: None  Radiology: US  Abdomen Limited RUQ (LIVER/GB) Result Date: 02/06/2024 CLINICAL DATA:  Right upper quadrant pain and elevated liver function test. EXAM: ULTRASOUND ABDOMEN LIMITED RIGHT UPPER QUADRANT COMPARISON:  None Available. FINDINGS: Gallbladder: Echogenic gallstones and gallbladder sludge are seen within the gallbladder lumen. The gallbladder wall measures 4.4 mm in thickness. No sonographic Murphy sign noted by sonographer. Common bile duct: Diameter: 2.7 mm Liver: No focal lesion identified. The contour of the left lobe of the liver is abnormal and technically limited in evaluation, as per the ultrasound technologist. Within normal limits in parenchymal echogenicity. Portal vein is patent on color Doppler imaging with normal direction of blood flow towards the liver. Other: Technically limited study secondary to overlying ribs and bowel gas, as per the ultrasound technologist. IMPRESSION: 1. Limited study, as described above. 2. Echogenic gallstones and gallbladder sludge without evidence of acute cholecystitis. Electronically Signed   By: Suzen Dials M.D.   On: 02/06/2024 20:57     Procedures   Medications Ordered in the ED - No data to display                                  Medical Decision Making Amount and/or Complexity of Data Reviewed Labs: ordered. Radiology: ordered.     Differential diagnosis includes but is not limited to  acute cholecystitis, cholelithiasis, cholangitis, choledocholithiasis, peptic ulcer, gastritis, gastroenteritis, appendicitis, IBS, IBD, DKA, nephrolithiasis, UTI, pyelonephritis, pancreatitis, diverticulitis, mesenteric ischemia, abdominal aortic aneurysm, small bowel obstruction, volvulus   ED Course:  Upon initial evaluation, patient is well-appearing, no acute distress.  Normal vital signs.  Upon examination, abdomen is soft and nontender currently.  He does report pain seems to have resolved  upon arrival to the emergency room.  Declines any pain or nausea medications at this time.  Labs Ordered: I Ordered, and personally interpreted labs.  The pertinent results include:   CBC with leukocytosis 11.1 CMP with elevated AST, ALT, alk phos, and T. bili at 278, 276, 128, and 1.6 respectively Lipase within normal limits  Imaging Studies ordered: I ordered imaging studies including right upper quadrant ultrasound I independently visualized the imaging with scope of interpretation limited to determining acute life threatening conditions related to emergency care. Imaging showed  IMPRESSION:  1. Limited study, as described above.  2. Echogenic gallstones and gallbladder sludge without evidence of  acute cholecystitis.   I  agree with the radiologist interpretation    Medications Given: None  Upon re-evaluation, patient remains without abdominal pain.  Remains with stable vitals.  His labs showed elevated AST, ALT, and T. bili.  Right upper quadrant ultrasound showed cholelithiasis without evidence of acute cholecystitis.  Doubt cholecystitis given no right upper quadrant abdominal pain on exam currently.  Given the intermittent right upper quadrant pain patient reports that home, and gallstones noted on ultrasound, suspect this is biliary colic.  We discussed that he will need to follow-up with general surgery to discuss removal of his gallbladder to prevent further episodes of pain and cholecystitis.  Patient does have a leukocytosis of 11.1, but is afebrile, not tachycardic, no signs of systemic infection.  He is tolerating p.o. intake without difficulty.  No current pain.  Stable and appropriate for discharge home    Impression: Biliary colic  Disposition:  The patient was discharged home with instructions to call to schedule appointment with [facility] surgery within the next month for follow-up of his cholelithiasis. Avoid fatty/fried foods. Return precautions given and  patient verbalized understanding.      This chart was dictated using voice recognition software, Dragon. Despite the best efforts of this provider to proofread and correct errors, errors may still occur which can change documentation meaning.       Final diagnoses:  Biliary colic  Gallstones    ED Discharge Orders     None          Veta Palma, PA-C 02/06/24 2205    Ellouise Fine K, [place] 02/06/24 2336
